# Patient Record
Sex: Male | Born: 1955 | Race: White | Hispanic: No | Marital: Married | Smoking: Current every day smoker
Health system: Southern US, Community
[De-identification: ages and names within clinical notes are randomized; demographics above are authoritative.]

## PROBLEM LIST (undated history)

## (undated) DIAGNOSIS — N400 Enlarged prostate without lower urinary tract symptoms: Secondary | ICD-10-CM

## (undated) DIAGNOSIS — E785 Hyperlipidemia, unspecified: Secondary | ICD-10-CM

## (undated) DIAGNOSIS — G4730 Sleep apnea, unspecified: Secondary | ICD-10-CM

## (undated) DIAGNOSIS — I493 Ventricular premature depolarization: Secondary | ICD-10-CM

## (undated) DIAGNOSIS — M1990 Unspecified osteoarthritis, unspecified site: Secondary | ICD-10-CM

## (undated) HISTORY — PX: OTHER SURGICAL HISTORY: SHX169

---

## 2007-01-26 ENCOUNTER — Ambulatory Visit: Payer: Self-pay | Admitting: Otolaryngology

## 2008-05-15 ENCOUNTER — Ambulatory Visit: Payer: Self-pay | Admitting: Unknown Physician Specialty

## 2011-05-24 DIAGNOSIS — G4730 Sleep apnea, unspecified: Secondary | ICD-10-CM | POA: Insufficient documentation

## 2014-01-28 DIAGNOSIS — N401 Enlarged prostate with lower urinary tract symptoms: Secondary | ICD-10-CM | POA: Insufficient documentation

## 2014-05-03 ENCOUNTER — Ambulatory Visit: Payer: Self-pay | Admitting: Unknown Physician Specialty

## 2014-05-07 LAB — PATHOLOGY REPORT

## 2019-04-04 ENCOUNTER — Other Ambulatory Visit (HOSPITAL_COMMUNITY): Payer: Self-pay | Admitting: Internal Medicine

## 2019-04-04 ENCOUNTER — Other Ambulatory Visit: Payer: Self-pay | Admitting: Internal Medicine

## 2019-04-04 DIAGNOSIS — R591 Generalized enlarged lymph nodes: Secondary | ICD-10-CM

## 2019-04-04 DIAGNOSIS — R599 Enlarged lymph nodes, unspecified: Secondary | ICD-10-CM

## 2019-04-05 ENCOUNTER — Other Ambulatory Visit: Payer: Self-pay

## 2019-04-05 DIAGNOSIS — Z800 Family history of malignant neoplasm of digestive organs: Secondary | ICD-10-CM

## 2019-04-05 DIAGNOSIS — Z1211 Encounter for screening for malignant neoplasm of colon: Secondary | ICD-10-CM

## 2019-04-05 MED ORDER — NA SULFATE-K SULFATE-MG SULF 17.5-3.13-1.6 GM/177ML PO SOLN: 1.0000 | Freq: Once | ORAL | 0 refills | Status: AC

## 2019-04-10 ENCOUNTER — Ambulatory Visit
Admission: RE | Admit: 2019-04-10 | Discharge: 2019-04-10 | Disposition: A | Discharge status: Home / Self Care | Payer: Medicare Other | Source: Ambulatory Visit | Attending: Internal Medicine | Admitting: Internal Medicine

## 2019-04-10 ENCOUNTER — Other Ambulatory Visit: Payer: Self-pay

## 2019-04-10 DIAGNOSIS — R591 Generalized enlarged lymph nodes: Secondary | ICD-10-CM | POA: Insufficient documentation

## 2019-04-10 DIAGNOSIS — R599 Enlarged lymph nodes, unspecified: Secondary | ICD-10-CM

## 2019-04-12 ENCOUNTER — Telehealth: Payer: Self-pay | Admitting: Gastroenterology

## 2019-04-12 NOTE — Telephone Encounter (Signed)
Pt is calling he would like to r/s his procedure for sometime after January due to his Deductible.

## 2019-04-16 NOTE — Telephone Encounter (Signed)
Returned patients call LVM for him to let him know his message was received to cancel his colonoscopy 05/08/19 due to deductible.  Asked him to call the office back in December to reschedule for the month of January due to insurance changes at the beginning of the year.  Kieth Brightly in Endo has been informed.  Thanks [employer]

## 2019-05-08 ENCOUNTER — Ambulatory Visit: Admit: 2019-05-08 | Payer: BC Managed Care – PPO | Admitting: Gastroenterology

## 2019-05-08 SURGERY — COLONOSCOPY WITH PROPOFOL
Anesthesia: General

## 2019-07-17 ENCOUNTER — Telehealth: Payer: Self-pay | Admitting: Gastroenterology

## 2019-07-17 NOTE — Telephone Encounter (Signed)
Pt left vm he was scheduled for an apt with Dr. Allen Norris but had to cancel it due to insurance but he will have changes in his insurance in January and would like to schedule please call pt

## 2019-07-23 NOTE — Telephone Encounter (Signed)
Returned patients phone call.  LVM for him to call office back in regards to scheduling his colonoscopy.  Scheduling Note: Schedule with Dr. Allen Norris [facility]  Dx: Screening colonoscopy z12.11, family history of colon cancer z80.0  Re-open referral.  Thanks Sharyn Lull

## 2019-07-24 ENCOUNTER — Other Ambulatory Visit: Payer: Self-pay

## 2019-07-24 ENCOUNTER — Telehealth: Payer: Self-pay

## 2019-07-24 DIAGNOSIS — Z800 Family history of malignant neoplasm of digestive organs: Secondary | ICD-10-CM

## 2019-07-24 DIAGNOSIS — Z1211 Encounter for screening for malignant neoplasm of colon: Secondary | ICD-10-CM

## 2019-07-24 NOTE — Telephone Encounter (Signed)
Colonoscopy has been rescheduled to Monday 08/20/19 at [facility] with Dr. Allen Norris.  Pt advised of COVID Test date Wed 08/15/19 at [place] located on [facility] between 10:30am and 12:30pm.  Instructions will be sent via mychart and mail.  Thanks [employer]

## 2019-08-08 ENCOUNTER — Other Ambulatory Visit: Payer: Self-pay

## 2019-08-08 ENCOUNTER — Encounter: Payer: Self-pay | Admitting: Gastroenterology

## 2019-08-16 ENCOUNTER — Other Ambulatory Visit
Admission: RE | Admit: 2019-08-16 | Discharge: 2019-08-16 | Disposition: A | Discharge status: Home / Self Care | Payer: Medicare Other | Source: Ambulatory Visit | Attending: Gastroenterology | Admitting: Gastroenterology

## 2019-08-16 DIAGNOSIS — Z01812 Encounter for preprocedural laboratory examination: Secondary | ICD-10-CM | POA: Insufficient documentation

## 2019-08-16 DIAGNOSIS — Z20822 Contact with and (suspected) exposure to covid-19: Secondary | ICD-10-CM | POA: Diagnosis not present

## 2019-08-16 LAB — SARS CORONAVIRUS 2 (TAT 6-24 HRS): SARS Coronavirus 2: NEGATIVE

## 2019-08-16 NOTE — Discharge Instructions (Signed)
General Anesthesia, Adult, Care After This sheet gives you information about how to care for yourself after your procedure. Your health care provider may also give you more specific instructions. If you have problems or questions, contact your health care provider. What can I expect after the procedure? After the procedure, the following side effects are common:  Pain or discomfort at the IV site.  Nausea.  Vomiting.  Sore throat.  Trouble concentrating.  Feeling cold or chills.  Weak or tired.  Sleepiness and fatigue.  Soreness and body aches. These side effects can affect parts of the body that were not involved in surgery. Follow these instructions at home:  For at least 24 hours after the procedure:  Have a responsible adult stay with you. It is important to have someone help care for you until you are awake and alert.  Rest as needed.  Do not: ? Participate in activities in which you could fall or become injured. ? Drive. ? Use heavy machinery. ? Drink alcohol. ? Take sleeping pills or medicines that cause drowsiness. ? Make important decisions or sign legal documents. ? Take care of children on your own. Eating and drinking  Follow any instructions from your health care provider about eating or drinking restrictions.  When you feel hungry, start by eating small amounts of foods that are soft and easy to digest (bland), such as toast. Gradually return to your regular diet.  Drink enough fluid to keep your urine pale yellow.  If you vomit, rehydrate by drinking water, juice, or clear broth. General instructions  If you have sleep apnea, surgery and certain medicines can increase your risk for breathing problems. Follow instructions from your health care provider about wearing your sleep device: ? Anytime you are sleeping, including during daytime naps. ? While taking prescription pain medicines, sleeping medicines, or medicines that make you drowsy.  Return to  your normal activities as told by your health care provider. Ask your health care provider what activities are safe for you.  Take over-the-counter and prescription medicines only as told by your health care provider.  If you smoke, do not smoke without supervision.  Keep all follow-up visits as told by your health care provider. This is important. Contact a health care provider if:  You have nausea or vomiting that does not get better with medicine.  You cannot eat or drink without vomiting.  You have pain that does not get better with medicine.  You are unable to pass urine.  You develop a skin rash.  You have a fever.  You have redness around your IV site that gets worse. Get help right away if:  You have difficulty breathing.  You have chest pain.  You have blood in your urine or stool, or you vomit blood. Summary  After the procedure, it is common to have a sore throat or nausea. It is also common to feel tired.  Have a responsible adult stay with you for the first 24 hours after general anesthesia. It is important to have someone help care for you until you are awake and alert.  When you feel hungry, start by eating small amounts of foods that are soft and easy to digest (bland), such as toast. Gradually return to your regular diet.  Drink enough fluid to keep your urine pale yellow.  Return to your normal activities as told by your health care provider. Ask your health care provider what activities are safe for you. This information is not   intended to replace advice given to you by your health care provider. Make sure you discuss any questions you have with your health care provider. Document Revised: 07/15/2017 Document Reviewed: 02/25/2017 Elsevier Patient Education  2020 Elsevier Inc.  

## 2019-08-20 ENCOUNTER — Ambulatory Visit: Payer: Medicare Other | Admitting: Anesthesiology

## 2019-08-20 ENCOUNTER — Encounter: Payer: Self-pay | Admitting: Gastroenterology

## 2019-08-20 ENCOUNTER — Ambulatory Visit
Admission: RE | Admit: 2019-08-20 | Discharge: 2019-08-20 | Disposition: A | Discharge status: Home / Self Care | Payer: Medicare Other | Attending: Gastroenterology | Admitting: Gastroenterology

## 2019-08-20 ENCOUNTER — Other Ambulatory Visit: Payer: Self-pay

## 2019-08-20 ENCOUNTER — Encounter
Admission: RE | Disposition: A | Discharge status: Home / Self Care | Payer: Self-pay | Source: Home / Self Care | Attending: Gastroenterology

## 2019-08-20 DIAGNOSIS — N400 Enlarged prostate without lower urinary tract symptoms: Secondary | ICD-10-CM | POA: Insufficient documentation

## 2019-08-20 DIAGNOSIS — Z860100 Personal history of colon polyps, unspecified: Secondary | ICD-10-CM

## 2019-08-20 DIAGNOSIS — K648 Other hemorrhoids: Secondary | ICD-10-CM | POA: Diagnosis not present

## 2019-08-20 DIAGNOSIS — Z79899 Other long term (current) drug therapy: Secondary | ICD-10-CM | POA: Insufficient documentation

## 2019-08-20 DIAGNOSIS — Z1211 Encounter for screening for malignant neoplasm of colon: Secondary | ICD-10-CM | POA: Insufficient documentation

## 2019-08-20 DIAGNOSIS — G4730 Sleep apnea, unspecified: Secondary | ICD-10-CM | POA: Diagnosis not present

## 2019-08-20 DIAGNOSIS — E785 Hyperlipidemia, unspecified: Secondary | ICD-10-CM | POA: Diagnosis not present

## 2019-08-20 DIAGNOSIS — F17210 Nicotine dependence, cigarettes, uncomplicated: Secondary | ICD-10-CM | POA: Insufficient documentation

## 2019-08-20 DIAGNOSIS — K5520 Angiodysplasia of colon without hemorrhage: Secondary | ICD-10-CM | POA: Diagnosis not present

## 2019-08-20 HISTORY — DX: Ventricular premature depolarization: I49.3

## 2019-08-20 HISTORY — PX: COLONOSCOPY WITH PROPOFOL: SHX5780

## 2019-08-20 HISTORY — DX: Hyperlipidemia, unspecified: E78.5

## 2019-08-20 HISTORY — DX: Sleep apnea, unspecified: G47.30

## 2019-08-20 HISTORY — DX: Benign prostatic hyperplasia without lower urinary tract symptoms: N40.0

## 2019-08-20 HISTORY — DX: Unspecified osteoarthritis, unspecified site: M19.90

## 2019-08-20 SURGERY — COLONOSCOPY WITH PROPOFOL
Anesthesia: General | Site: Rectum

## 2019-08-20 MED ORDER — LACTATED RINGERS IV SOLN: INTRAVENOUS | Status: DC

## 2019-08-20 MED ORDER — STERILE WATER FOR IRRIGATION IR SOLN
Status: DC | PRN
  Administered 2019-08-20: 09:00:00 50 mL

## 2019-08-20 MED ORDER — LIDOCAINE HCL (CARDIAC) PF 100 MG/5ML IV SOSY
PREFILLED_SYRINGE | INTRAVENOUS | Status: DC | PRN
  Administered 2019-08-20: 30 mg via INTRAVENOUS

## 2019-08-20 MED ORDER — PROPOFOL 10 MG/ML IV BOLUS
INTRAVENOUS | Status: DC | PRN
  Administered 2019-08-20: 40 mg via INTRAVENOUS
  Administered 2019-08-20: 20 mg via INTRAVENOUS
  Administered 2019-08-20: 50 mg via INTRAVENOUS
  Administered 2019-08-20: 100 mg via INTRAVENOUS
  Administered 2019-08-20: 40 mg via INTRAVENOUS

## 2019-08-20 SURGICAL SUPPLY — 14 items
CANISTER SUCT 1200ML W/VALVE (MISCELLANEOUS) ×3 IMPLANT
CLIP HMST 235XBRD CATH ROT (MISCELLANEOUS) IMPLANT
CLIP RESOLUTION 360 11X235 (MISCELLANEOUS)
ELECT REM PT RETURN 9FT ADLT (ELECTROSURGICAL)
ELECTRODE REM PT RTRN 9FT ADLT (ELECTROSURGICAL) IMPLANT
FORCEPS BIOP RAD 4 LRG CAP 4 (CUTTING FORCEPS) IMPLANT
GOWN CVR UNV OPN BCK APRN NK (MISCELLANEOUS) ×2 IMPLANT
GOWN ISOL THUMB LOOP REG UNIV (MISCELLANEOUS) ×4
INJECTOR VARIJECT VIN23 (MISCELLANEOUS) IMPLANT
KIT ENDO PROCEDURE OLY (KITS) ×3 IMPLANT
SNARE SHORT THROW 13M SML OVAL (MISCELLANEOUS) IMPLANT
TRAP ETRAP POLY (MISCELLANEOUS) IMPLANT
VARIJECT INJECTOR VIN23 (MISCELLANEOUS)
WATER STERILE IRR 250ML POUR (IV SOLUTION) ×3 IMPLANT

## 2019-08-20 NOTE — H&P (Signed)
Lucilla Lame, MD [address] [place], [place] [address] Phone:[phone] Fax : [phone]  Primary Care Physician:  Idelle Crouch, MD Primary Gastroenterologist:  Dr. Allen Norris  Pre-Procedure History & Physical: HPI:  Darrell Smith is a 64 y.o. male is here for an colonoscopy.   Past Medical History:  Diagnosis Date  . Arthritis    osteo-knee  . BPH (benign prostatic hyperplasia)   . Hyperlipidemia   . PVC (premature ventricular contraction)    occasional  . Sleep apnea    moderate    Past Surgical History:  Procedure Laterality Date  . OTHER SURGICAL HISTORY     maxillofacial surgery    Prior to Admission medications   Medication Sig Start Date End Date Taking? Authorizing Provider  KRILL OIL PO Take by mouth.   Yes [provider]  LORazepam (ATIVAN) 1 MG tablet Take 1 mg by mouth every 8 (eight) hours.   Yes [provider]  OVER THE COUNTER MEDICATION niacin   Yes [provider]  tamsulosin (FLOMAX) 0.4 MG CAPS capsule Take 0.4 mg by mouth daily. am   Yes [provider]  zolpidem (AMBIEN CR) 12.5 MG CR tablet Take 12.5 mg by mouth at bedtime as needed for sleep.   Yes [provider]    Allergies as of 07/24/2019  . (Not on File)    History reviewed. No pertinent family history.  Social History   Socioeconomic History  . Marital status: Married    Spouse name: Not on file  . Number of children: Not on file  . Years of education: Not on file  . Highest education level: Not on file  Occupational History  . Not on file  Tobacco Use  . Smoking status: Current Every Day Smoker    Packs/day: 0.50    Years: 30.00    Pack years: 15.00    Types: Cigarettes  . Smokeless tobacco: Never Used  Substance and Sexual Activity  . Alcohol use: Never  . Drug use: Never  . Sexual activity: Not on file  Other Topics Concern  . Not on file  Social History Narrative  . Not on file   Social  Determinants of Health   Financial Resource Strain:   . Difficulty of Paying Living Expenses: Not on file  Food Insecurity:   . Worried About Charity fundraiser in the Last Year: Not on file  . Ran Out of Food in the Last Year: Not on file  Transportation Needs:   . Lack of Transportation (Medical): Not on file  . Lack of Transportation (Non-Medical): Not on file  Physical Activity:   . Days of Exercise per Week: Not on file  . Minutes of Exercise per Session: Not on file  Stress:   . Feeling of Stress : Not on file  Social Connections:   . Frequency of Communication with Friends and Family: Not on file  . Frequency of Social Gatherings with Friends and Family: Not on file  . Attends Religious Services: Not on file  . Active Member of Clubs or Organizations: Not on file  . Attends Archivist Meetings: Not on file  . Marital Status: Not on file  Intimate Partner Violence:   . Fear of Current or Ex-Partner: Not on file  . Emotionally Abused: Not on file  . Physically Abused: Not on file  . Sexually Abused: Not on file    Review of Systems: See HPI, otherwise negative ROS  Physical Exam: BP 134/64   Pulse 72   Temp 97.8 F (36.6 C) (Temporal)   Resp 16   Ht 5\' 10"  (1.778 m)   Wt 70.3 kg   SpO2 100%   BMI 22.24 kg/m  General:   Alert,  pleasant and cooperative in NAD Head:  Normocephalic and atraumatic. Neck:  Supple; no masses or thyromegaly. Lungs:  Clear throughout to auscultation.    Heart:  Regular rate and rhythm. Abdomen:  Soft, nontender and nondistended. Normal bowel sounds, without guarding, and without rebound.   Neurologic:  Alert and  oriented x4;  grossly normal neurologically.  Impression/Plan: Yerik Zeringue is here for an colonoscopy to be performed for history of adenomatous colon polyps 05/06/2014  Risks, benefits, limitations, and alternatives regarding  colonoscopy have been reviewed with the patient.  Questions have been answered.   All parties agreeable.   07/06/2014, MD  08/20/2019, 8:55 AM

## 2019-08-20 NOTE — Op Note (Signed)
[facility] Gastroenterology Patient Name: Darrell Smith Procedure Date: 08/20/2019 8:55 AM MRN: [id number] Account #: [id number] Date of Birth: 15-Jul-1956 Admit Type: Outpatient Age: 64 Room: [facility] OR ROOM 01 Gender: Male Note Status: Finalized Procedure:             Colonoscopy Indications:           High risk colon cancer surveillance: Personal history                         of colonic polyps Providers:             Lucilla Lame MD, MD Referring MD:          Leonie Douglas. Doy Hutching, MD (Referring MD) Medicines:             Propofol per Anesthesia Complications:         No immediate complications. Procedure:             Pre-Anesthesia Assessment:                        - Prior to the procedure, a History and Physical was                         performed, and patient medications and allergies were                         reviewed. The patient's tolerance of previous                         anesthesia was also reviewed. The risks and benefits                         of the procedure and the sedation options and risks                         were discussed with the patient. All questions were                         answered, and informed consent was obtained. Prior                         Anticoagulants: The patient has taken no previous                         anticoagulant or antiplatelet agents. ASA Grade                         Assessment: II - A patient with mild systemic disease.                         After reviewing the risks and benefits, the patient                         was deemed in satisfactory condition to undergo the                         procedure.  After obtaining informed consent, the colonoscope was                         passed under direct vision. Throughout the procedure,                         the patient's blood pressure, pulse, and oxygen                         saturations were monitored continuously. The was                          introduced through the anus and advanced to the the                         cecum, identified by appendiceal orifice and ileocecal                         valve. The colonoscopy was performed without                         difficulty. The patient tolerated the procedure well.                         The quality of the bowel preparation was excellent. Findings:      The perianal and digital rectal examinations were normal.      A single medium-sized angiodysplastic lesion without bleeding was found       in the transverse colon.      Non-bleeding internal hemorrhoids were found during retroflexion. Impression:            - A single non-bleeding colonic angiodysplastic lesion.                        - Non-bleeding internal hemorrhoids.                        - No specimens collected. Recommendation:        - Discharge patient to home.                        - Resume previous diet.                        - Continue present medications.                        - Await pathology results.                        - Repeat colonoscopy in 5 years for surveillance. Procedure Code(s):     --- Professional ---                        [id number], Colonoscopy, flexible; diagnostic, including                         collection of specimen(s) by brushing or washing, when                         performed (separate  procedure) Diagnosis Code(s):     --- Professional ---                        Z86.010, Personal history of colonic polyps CPT copyright 2019 American Medical Association. All rights reserved. The codes documented in this report are preliminary and upon coder review may  be revised to meet current compliance requirements. Midge Minium MD, MD 08/20/2019 9:21:09 AM This report has been signed electronically. Number of Addenda: 0 Note Initiated On: 08/20/2019 8:55 AM Scope Withdrawal Time: 0 hours 8 minutes 14 seconds  Total Procedure Duration: 0 hours 11 minutes 52 seconds   Estimated Blood Loss:  Estimated blood loss: none.      [facility]

## 2019-08-20 NOTE — Anesthesia Procedure Notes (Signed)
Date/Time: 08/20/2019 9:04 AM Performed by: Maree Krabbe, CRNA Pre-anesthesia Checklist: Patient identified, Emergency Drugs available, Suction available, Timeout performed and Patient being monitored Patient Re-evaluated:Patient Re-evaluated prior to induction Oxygen Delivery Method: Nasal cannula Placement Confirmation: positive ETCO2

## 2019-08-20 NOTE — Anesthesia Postprocedure Evaluation (Signed)
Anesthesia Post Note  Patient: Darrell Smith  Procedure(s) Performed: COLONOSCOPY WITH PROPOFOL (N/A Rectum)     Patient location during evaluation: PACU Anesthesia Type: General Level of consciousness: awake and alert Pain management: pain level controlled Vital Signs Assessment: post-procedure vital signs reviewed and stable Respiratory status: spontaneous breathing, nonlabored ventilation, respiratory function stable and patient connected to nasal cannula oxygen Cardiovascular status: blood pressure returned to baseline and stable Postop Assessment: no apparent nausea or vomiting Anesthetic complications: no    Wille Celeste Saifullah Jolley

## 2019-08-20 NOTE — Anesthesia Preprocedure Evaluation (Signed)
Anesthesia Evaluation  Patient identified by MRN, date of birth, ID band Patient awake    History of Anesthesia Complications Negative for: history of anesthetic complications  Airway Mallampati: I  TM Distance: <3 FB Neck ROM: Full    Dental no notable dental hx.    Pulmonary neg pulmonary ROS, Current Smoker,    Pulmonary exam normal        Cardiovascular Exercise Tolerance: Good negative cardio ROS Normal cardiovascular exam     Neuro/Psych negative neurological ROS  negative psych ROS   GI/Hepatic negative GI ROS, Neg liver ROS,   Endo/Other  negative endocrine ROS  Renal/GU negative Renal ROS     Musculoskeletal negative musculoskeletal ROS (+)   Abdominal   Peds  Hematology negative hematology ROS (+)   Anesthesia Other Findings   Reproductive/Obstetrics                             Anesthesia Physical Anesthesia Plan  ASA: I  Anesthesia Plan: General   Post-op Pain Management:    Induction: Intravenous  PONV Risk Score and Plan: 1 and Propofol infusion, TIVA and Treatment may vary due to age or medical condition  Airway Management Planned: Nasal Cannula and Natural Airway  Additional Equipment: None  Intra-op Plan:   Post-operative Plan:   Informed Consent: I have reviewed the patients History and Physical, chart, labs and discussed the procedure including the risks, benefits and alternatives for the proposed anesthesia with the patient or authorized representative who has indicated his/her understanding and acceptance.       Plan Discussed with: CRNA  Anesthesia Plan Comments:         Anesthesia Quick Evaluation

## 2019-08-20 NOTE — Transfer of Care (Signed)
Immediate Anesthesia Transfer of Care Note  Patient: Darrell Smith  Procedure(s) Performed: COLONOSCOPY WITH PROPOFOL (N/A Rectum)  Patient Location: PACU  Anesthesia Type: General  Level of Consciousness: awake, alert  and patient cooperative  Airway and Oxygen Therapy: Patient Spontanous Breathing and Patient connected to supplemental oxygen  Post-op Assessment: Post-op Vital signs reviewed, Patient's Cardiovascular Status Stable, Respiratory Function Stable, Patent Airway and No signs of Nausea or vomiting  Post-op Vital Signs: Reviewed and stable  Complications: No apparent anesthesia complications

## 2019-08-21 ENCOUNTER — Encounter: Payer: Self-pay | Admitting: *Deleted

## 2019-09-15 ENCOUNTER — Ambulatory Visit: Payer: Medicare Other | Attending: Internal Medicine

## 2019-09-15 DIAGNOSIS — Z23 Encounter for immunization: Secondary | ICD-10-CM

## 2019-09-15 NOTE — Progress Notes (Signed)
   Covid-19 Vaccination Clinic  Name:  Dez Stauffer    MRN: [id number] DOB: [birth year]  09/15/2019  Mr. Petite was observed post Covid-19 immunization for 30 minutes based on pre-vaccination screening without incidence. He was provided with Vaccine Information Sheet and instruction to access the V-Safe system.   Mr. Usrey was instructed to call 911 with any severe reactions post vaccine: Marland Kitchen Difficulty breathing  . Swelling of your face and throat  . A fast heartbeat  . A bad rash all over your body  . Dizziness and weakness    Immunizations Administered    Name Date Dose VIS Date Route   Pfizer COVID-19 Vaccine 09/15/2019 10:07 AM 0.3 mL 07/06/2019 Intramuscular   Manufacturer: [employer], [employer]   Lot: [id number]   NDC: [address]-2

## 2019-10-09 ENCOUNTER — Ambulatory Visit: Payer: Medicare Other | Attending: Internal Medicine

## 2019-10-09 DIAGNOSIS — Z23 Encounter for immunization: Secondary | ICD-10-CM

## 2019-10-09 NOTE — Progress Notes (Signed)
   Covid-19 Vaccination Clinic  Name:  Shanard Treto    MRN: [id number] DOB: 07/05/1956  10/09/2019  Mr. Branch was observed post Covid-19 immunization for 15 minutes without incident. He was provided with Vaccine Information Sheet and instruction to access the V-Safe system.   Mr. Fischman was instructed to call 911 with any severe reactions post vaccine: Marland Kitchen Difficulty breathing  . Swelling of face and throat  . A fast heartbeat  . A bad rash all over body  . Dizziness and weakness   Immunizations Administered    Name Date Dose VIS Date Route   Pfizer COVID-19 Vaccine 10/09/2019 10:18 AM 0.3 mL 07/06/2019 Intramuscular   Manufacturer: [employer], [employer]   Lot: [id number]   NDC: [address]-1

## 2021-01-04 IMAGING — US US SOFT TISSUE HEAD/NECK
1 series · 14 of 17 positions shown · non-contrast
Comparison: None.

CLINICAL DATA: Left-sided neck mass.  Evaluate for lymphadenopathy.

EXAM:
ULTRASOUND OF HEAD/NECK SOFT TISSUES
TECHNIQUE: Ultrasound examination of the head and neck soft tissues was
performed in the area of clinical concern.

[Series 1: us soft tissue head/neck · 14 of 17 slices shown]
[im 1/17]
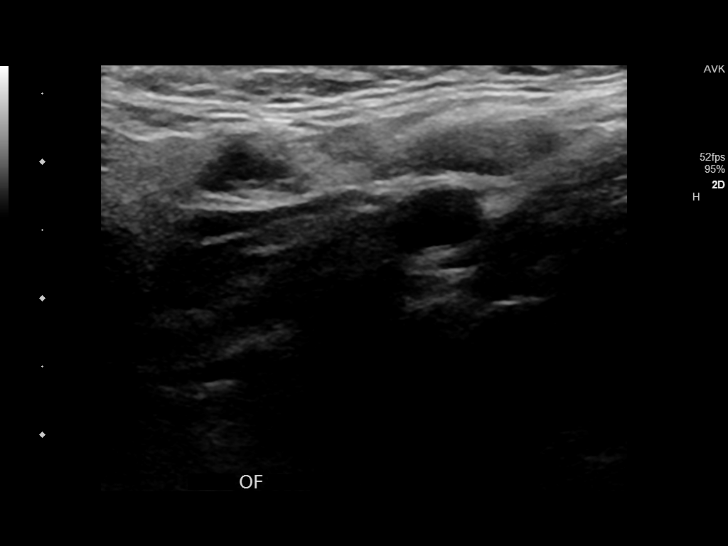
[im 2/17]
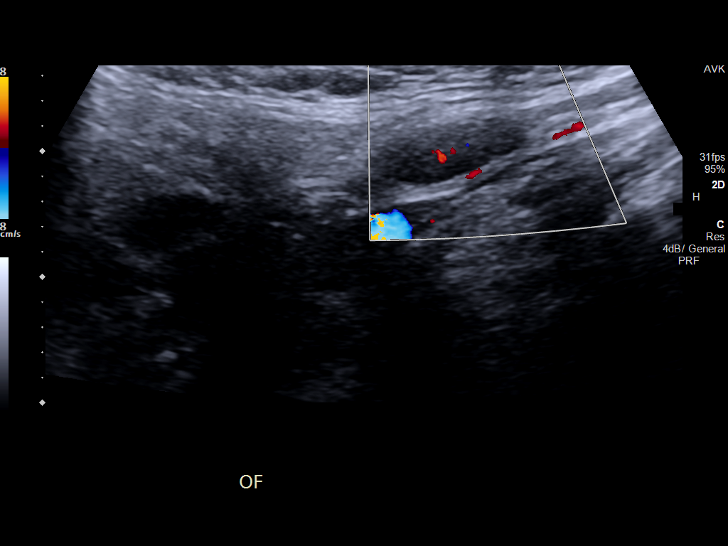
[im 4/17]
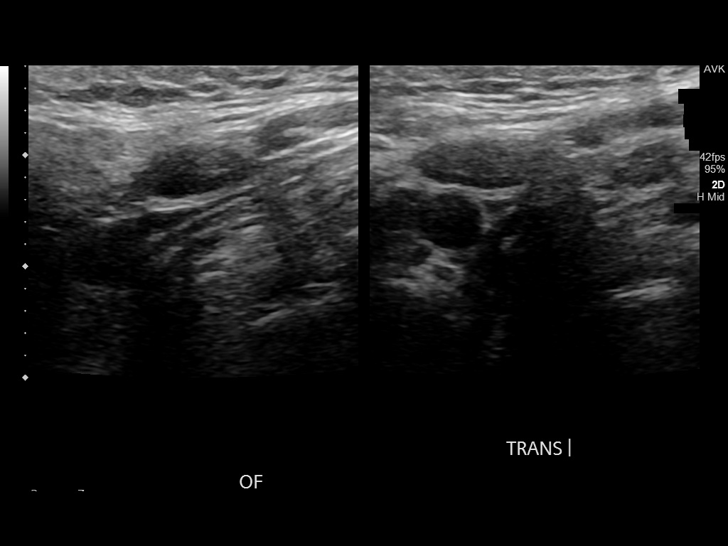
[im 5/17]
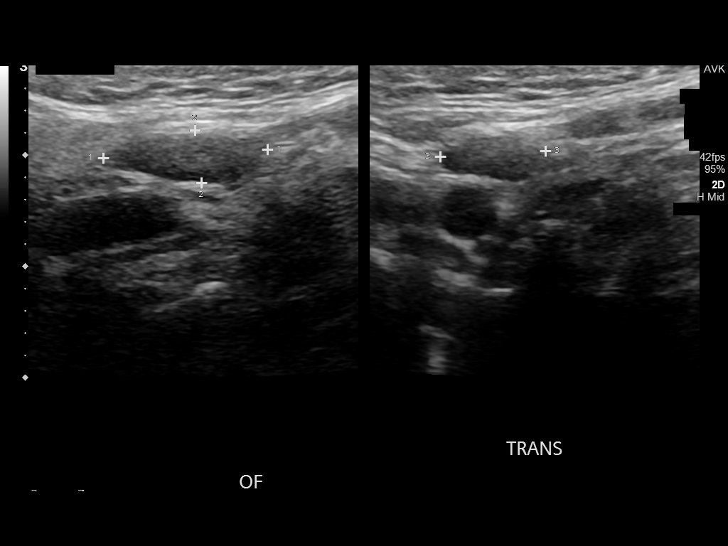
[im 6/17]
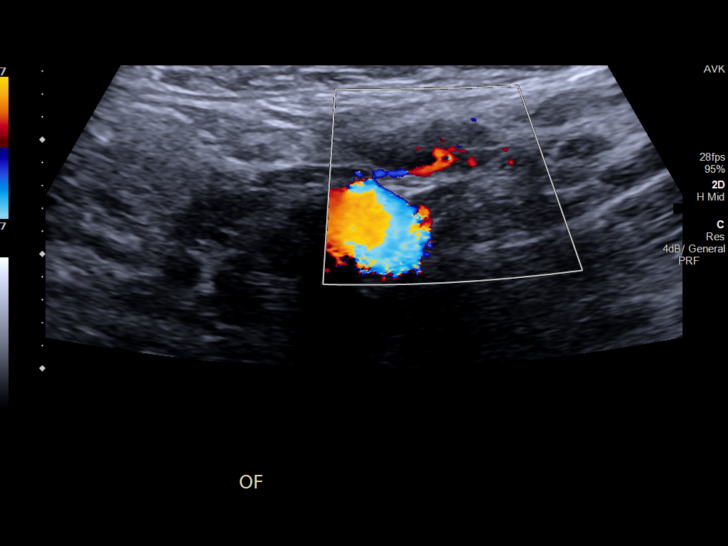
[im 7/17]
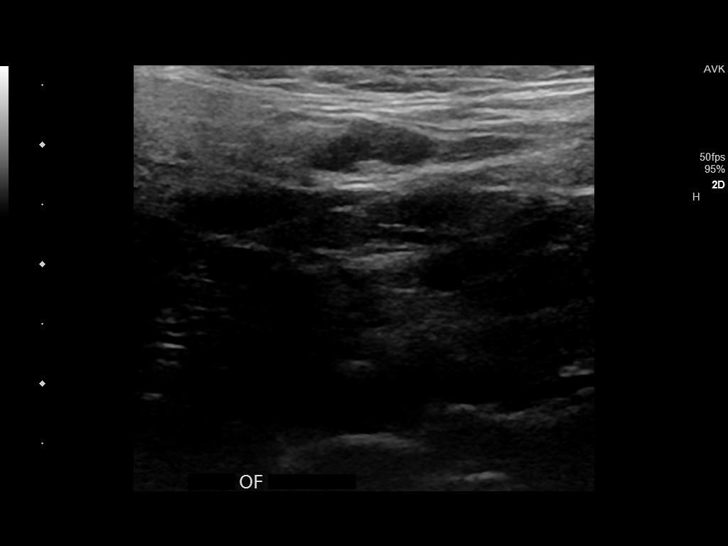
[im 8/17]
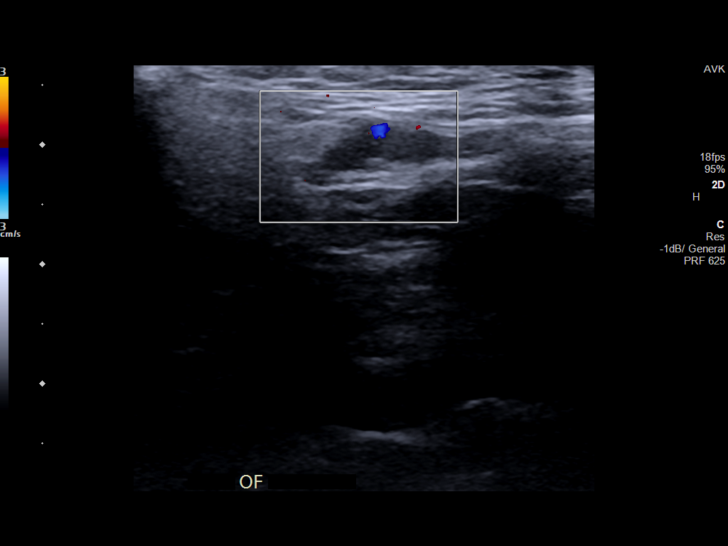
[im 10/17]
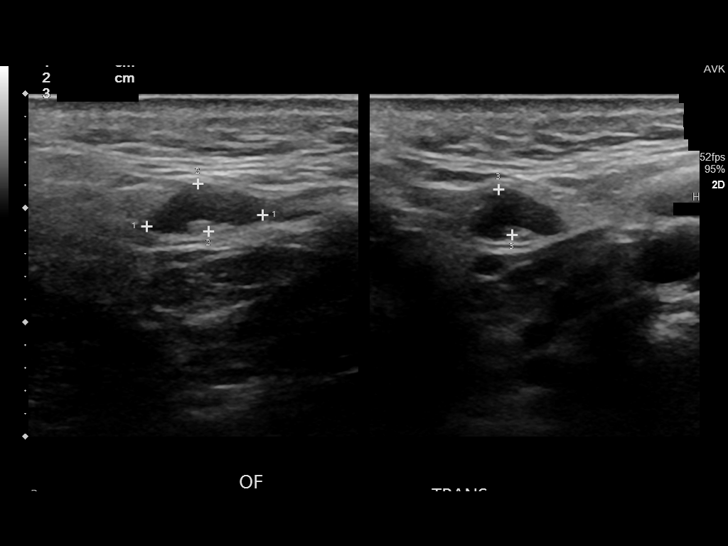
[im 11/17]
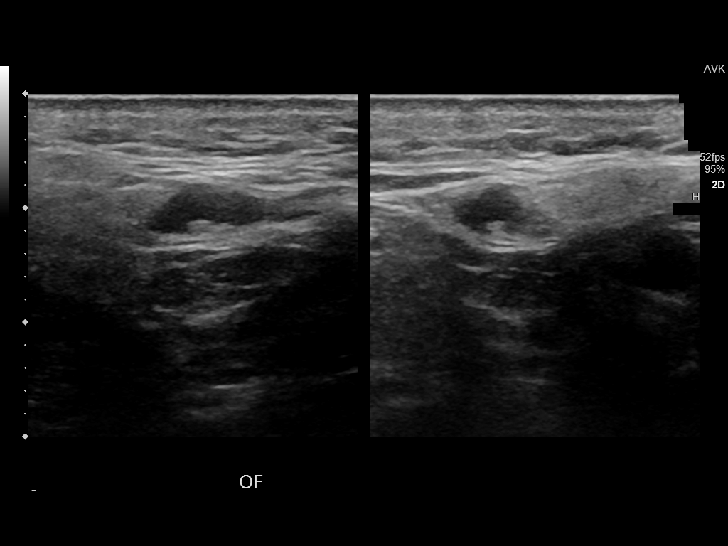
[im 12/17]
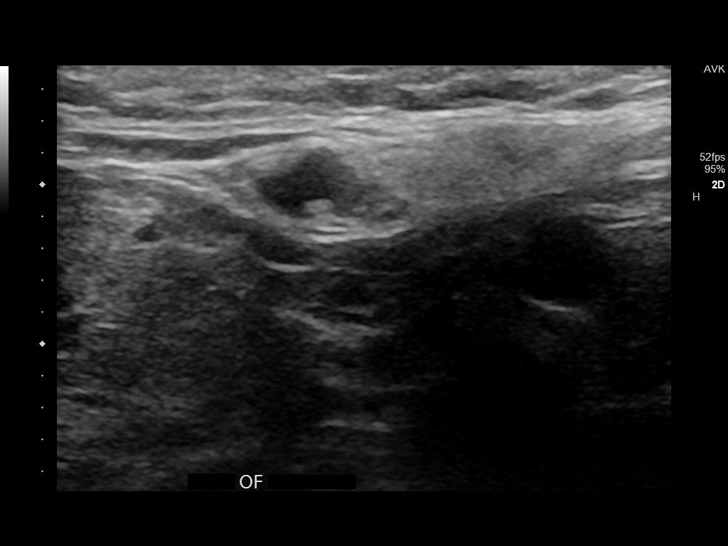
[im 13/17]
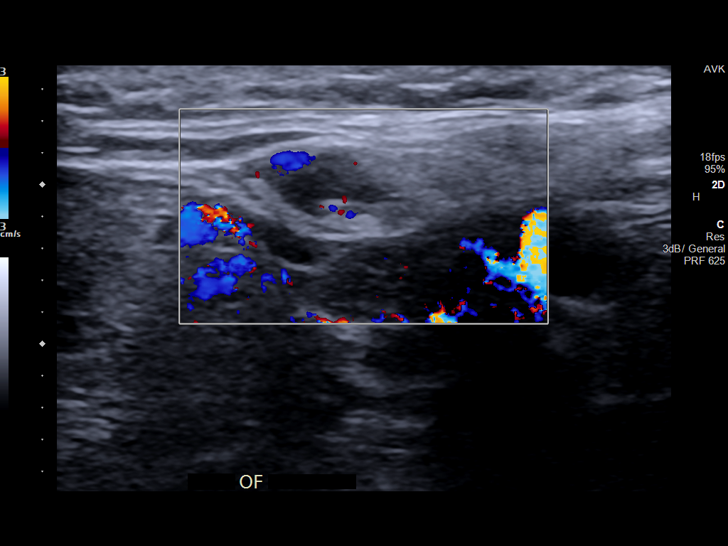
[im 14/17]
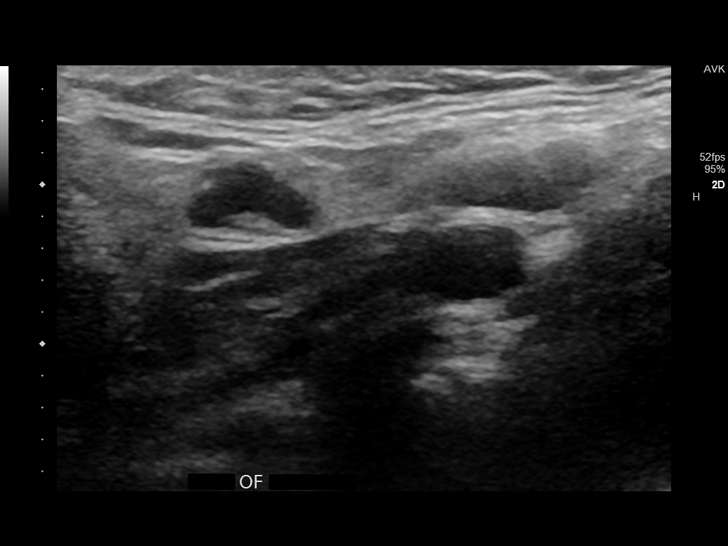
[im 16/17]
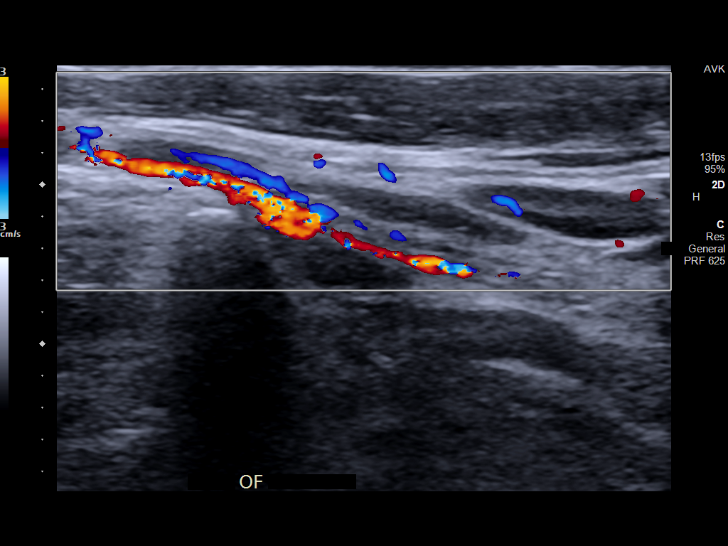
[im 17/17]
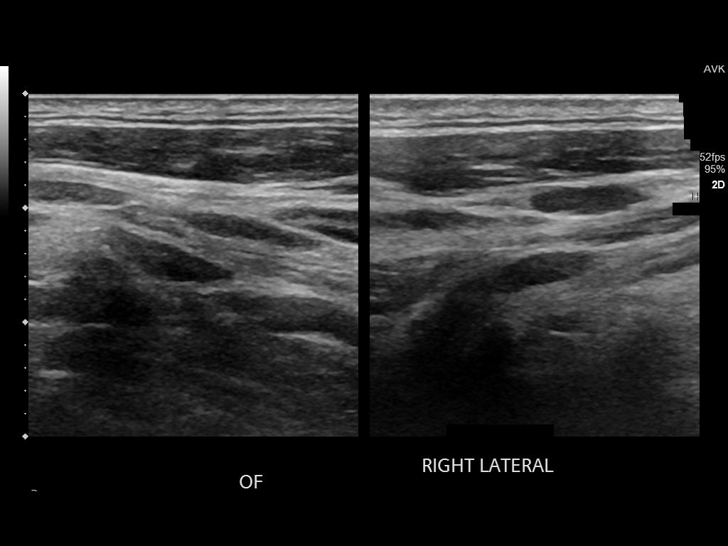

[14 of 17 positions shown; findings below may reference images not displayed]

FINDINGS: Patient's palpable area of concern correlates with 2 benign
appearing non pathologically enlarged subcutaneous cervical lymph
nodes with dominant lymph node measuring 0.5 cm in greatest short
axis diameter and adjacent lymph node measuring approximately 0.4 cm
in greatest short axis diameter.

Similar appearing non pathologically enlarged subcutaneous cervical
lymph nodes are noted within the contralateral right neck (image
18).

There is no additional sonographic correlate for patient's palpable
area of concern.
IMPRESSION: Benign-appearing, non pathologically enlarged cervical lymph nodes
correlate with the patient's palpable area of concern within the
left neck, nonspecific though presumably reactive in etiology.
Clinical correlation to resolution is advised.

## 2022-07-28 ENCOUNTER — Other Ambulatory Visit: Payer: Self-pay | Admitting: Internal Medicine

## 2022-07-28 DIAGNOSIS — G8929 Other chronic pain: Secondary | ICD-10-CM

## 2022-07-31 ENCOUNTER — Ambulatory Visit
Admission: RE | Admit: 2022-07-31 | Discharge: 2022-07-31 | Disposition: A | Discharge status: Home / Self Care | Payer: Medicare PPO | Source: Ambulatory Visit | Attending: Internal Medicine | Admitting: Internal Medicine

## 2022-07-31 DIAGNOSIS — G8929 Other chronic pain: Secondary | ICD-10-CM

## 2022-09-24 ENCOUNTER — Encounter: Payer: Self-pay | Admitting: Internal Medicine

## 2022-09-27 ENCOUNTER — Other Ambulatory Visit: Payer: Medicare PPO

## 2022-09-27 ENCOUNTER — Inpatient Hospital Stay: Admission: RE | Admit: 2022-09-27 | Payer: Medicare PPO | Source: Ambulatory Visit

## 2022-10-09 ENCOUNTER — Ambulatory Visit
Admission: RE | Admit: 2022-10-09 | Discharge: 2022-10-09 | Disposition: A | Discharge status: Home / Self Care | Payer: Medicare PPO | Source: Ambulatory Visit | Attending: Internal Medicine | Admitting: Internal Medicine

## 2022-10-09 DIAGNOSIS — G8929 Other chronic pain: Secondary | ICD-10-CM

## 2024-06-27 ENCOUNTER — Other Ambulatory Visit: Payer: Self-pay

## 2024-06-27 ENCOUNTER — Telehealth: Payer: Self-pay

## 2024-06-27 DIAGNOSIS — Z860100 Personal history of colon polyps, unspecified: Secondary | ICD-10-CM

## 2024-06-27 DIAGNOSIS — Z800 Family history of malignant neoplasm of digestive organs: Secondary | ICD-10-CM

## 2024-06-27 MED ORDER — NA SULFATE-K SULFATE-MG SULF 17.5-3.13-1.6 GM/177ML PO SOLN: 354.0000 mL | Freq: Once | ORAL | 0 refills | Status: AC

## 2024-06-27 NOTE — Telephone Encounter (Signed)
 Gastroenterology Pre-Procedure Review  Request Date: 10/01/2024 Requesting Physician: Dr. Jinny  PATIENT REVIEW QUESTIONS: The patient responded to the following health history questions as indicated:    1. Are you having any GI issues? no 2. Do you have a personal history of Polyps? yes (Dr. Jinny Polyps 08/20/2019) 3. Do you have a family history of Colon Cancer or Polyps? yes (Paternal GM colon CA) 4. Diabetes Mellitus? no 5. Joint replacements in the past 12 months?no 6. Major health problems in the past 3 months?no 7. Any artificial heart valves, MVP, or defibrillator?no    MEDICATIONS & ALLERGIES:    Patient reports the following regarding taking any anticoagulation/antiplatelet therapy:   Plavix, Coumadin, Eliquis, Xarelto, Lovenox, Pradaxa, Brilinta, or Effient? no Aspirin? no  Patient confirms/reports the following medications:  Current Outpatient Medications  Medication Sig Dispense Refill   KRILL OIL PO Take by mouth.     LORazepam (ATIVAN) 1 MG tablet Take 1 mg by mouth every 8 (eight) hours.     OVER THE COUNTER MEDICATION niacin     tamsulosin (FLOMAX) 0.4 MG CAPS capsule Take 0.4 mg by mouth daily. am     zolpidem (AMBIEN CR) 12.5 MG CR tablet Take 12.5 mg by mouth at bedtime as needed for sleep.     No current facility-administered medications for this visit.    Patient confirms/reports the following allergies:  Allergies  Allergen Reactions   Latex Itching    When wore rubber gloves    No orders of the defined types were placed in this encounter.   AUTHORIZATION INFORMATION Primary Insurance: 1D#: Group #:  Secondary Insurance: 1D#: Group #:  SCHEDULE INFORMATION: Date: 10/01/2024 Time: Location: [facility] Dr. Jinny

## 2024-07-17 ENCOUNTER — Encounter: Payer: Self-pay | Admitting: Dermatology

## 2024-07-17 ENCOUNTER — Ambulatory Visit: Admitting: Dermatology

## 2024-07-17 DIAGNOSIS — L570 Actinic keratosis: Secondary | ICD-10-CM

## 2024-07-17 DIAGNOSIS — Z1283 Encounter for screening for malignant neoplasm of skin: Secondary | ICD-10-CM | POA: Diagnosis not present

## 2024-07-17 DIAGNOSIS — I493 Ventricular premature depolarization: Secondary | ICD-10-CM | POA: Insufficient documentation

## 2024-07-17 DIAGNOSIS — D1801 Hemangioma of skin and subcutaneous tissue: Secondary | ICD-10-CM

## 2024-07-17 DIAGNOSIS — L821 Other seborrheic keratosis: Secondary | ICD-10-CM | POA: Diagnosis not present

## 2024-07-17 DIAGNOSIS — L814 Other melanin hyperpigmentation: Secondary | ICD-10-CM

## 2024-07-17 DIAGNOSIS — E781 Pure hyperglyceridemia: Secondary | ICD-10-CM | POA: Insufficient documentation

## 2024-07-17 DIAGNOSIS — L578 Other skin changes due to chronic exposure to nonionizing radiation: Secondary | ICD-10-CM

## 2024-07-17 DIAGNOSIS — W908XXA Exposure to other nonionizing radiation, initial encounter: Secondary | ICD-10-CM | POA: Diagnosis not present

## 2024-07-17 DIAGNOSIS — T7840XA Allergy, unspecified, initial encounter: Secondary | ICD-10-CM | POA: Insufficient documentation

## 2024-07-17 DIAGNOSIS — L590 Erythema ab igne [dermatitis ab igne]: Secondary | ICD-10-CM | POA: Diagnosis not present

## 2024-07-17 DIAGNOSIS — D239 Other benign neoplasm of skin, unspecified: Secondary | ICD-10-CM

## 2024-07-17 DIAGNOSIS — D229 Melanocytic nevi, unspecified: Secondary | ICD-10-CM

## 2024-07-17 NOTE — Patient Instructions (Signed)
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
## 2024-10-01 ENCOUNTER — Ambulatory Visit: Admit: 2024-10-01 | Admitting: Gastroenterology

## 2024-10-01 SURGERY — COLONOSCOPY
Anesthesia: General

## 2025-07-22 ENCOUNTER — Encounter: Admitting: Dermatology
